# Patient Record
Sex: Female | Born: 1960 | Race: White | Hispanic: No | Smoking: Current every day smoker
Health system: Southern US, Community
[De-identification: ages and names within clinical notes are randomized; demographics above are authoritative.]

## PROBLEM LIST (undated history)

## (undated) DIAGNOSIS — O020 Blighted ovum and nonhydatidiform mole: Secondary | ICD-10-CM

## (undated) DIAGNOSIS — C539 Malignant neoplasm of cervix uteri, unspecified: Secondary | ICD-10-CM

## (undated) DIAGNOSIS — K635 Polyp of colon: Secondary | ICD-10-CM

## (undated) DIAGNOSIS — D249 Benign neoplasm of unspecified breast: Secondary | ICD-10-CM

## (undated) HISTORY — DX: Benign neoplasm of unspecified breast: D24.9

## (undated) HISTORY — DX: Blighted ovum and nonhydatidiform mole: O02.0

## (undated) HISTORY — PX: BREAST BIOPSY: SHX20

## (undated) HISTORY — DX: Malignant neoplasm of cervix uteri, unspecified: C53.9

## (undated) HISTORY — DX: Polyp of colon: K63.5

## (undated) HISTORY — PX: DILATION AND CURETTAGE OF UTERUS: SHX78

## (undated) HISTORY — PX: BREAST SURGERY: SHX581

## (undated) HISTORY — PX: ANTERIOR CRUCIATE LIGAMENT REPAIR: SHX115

---

## 1989-01-13 HISTORY — PX: CERVICAL BIOPSY  W/ LOOP ELECTRODE EXCISION: SUR135

## 2005-09-03 ENCOUNTER — Other Ambulatory Visit: Admission: RE | Admit: 2005-09-03 | Discharge: 2005-09-03 | Payer: Self-pay | Admitting: Gynecology

## 2005-09-17 ENCOUNTER — Encounter: Admission: RE | Admit: 2005-09-17 | Discharge: 2005-09-17 | Payer: Self-pay | Admitting: Gynecology

## 2005-11-07 ENCOUNTER — Ambulatory Visit (HOSPITAL_COMMUNITY): Admission: RE | Admit: 2005-11-07 | Discharge: 2005-11-07 | Payer: Self-pay | Admitting: Gastroenterology

## 2005-11-14 ENCOUNTER — Encounter (INDEPENDENT_AMBULATORY_CARE_PROVIDER_SITE_OTHER): Payer: Self-pay | Admitting: *Deleted

## 2005-11-14 ENCOUNTER — Ambulatory Visit (HOSPITAL_BASED_OUTPATIENT_CLINIC_OR_DEPARTMENT_OTHER): Admission: RE | Admit: 2005-11-14 | Discharge: 2005-11-14 | Payer: Self-pay | Admitting: General Surgery

## 2006-05-14 ENCOUNTER — Other Ambulatory Visit: Admission: RE | Admit: 2006-05-14 | Discharge: 2006-05-14 | Payer: Self-pay | Admitting: Gynecology

## 2008-05-26 ENCOUNTER — Ambulatory Visit: Payer: Self-pay | Admitting: Gynecology

## 2008-05-26 ENCOUNTER — Encounter: Payer: Self-pay | Admitting: Gynecology

## 2008-05-26 ENCOUNTER — Other Ambulatory Visit: Admission: RE | Admit: 2008-05-26 | Discharge: 2008-05-26 | Payer: Self-pay | Admitting: Gynecology

## 2008-06-06 ENCOUNTER — Encounter: Admission: RE | Admit: 2008-06-06 | Discharge: 2008-06-06 | Payer: Self-pay | Admitting: Gynecology

## 2008-06-09 ENCOUNTER — Ambulatory Visit: Payer: Self-pay | Admitting: Gynecology

## 2008-06-09 ENCOUNTER — Encounter: Admission: RE | Admit: 2008-06-09 | Discharge: 2008-06-09 | Payer: Self-pay | Admitting: Gynecology

## 2008-11-28 ENCOUNTER — Other Ambulatory Visit: Admission: RE | Admit: 2008-11-28 | Discharge: 2008-11-28 | Payer: Self-pay | Admitting: Gynecology

## 2008-11-28 ENCOUNTER — Ambulatory Visit: Payer: Self-pay | Admitting: Gynecology

## 2009-05-29 ENCOUNTER — Other Ambulatory Visit: Admission: RE | Admit: 2009-05-29 | Discharge: 2009-05-29 | Payer: Self-pay | Admitting: Gynecology

## 2009-05-29 ENCOUNTER — Ambulatory Visit: Payer: Self-pay | Admitting: Gynecology

## 2010-02-04 ENCOUNTER — Encounter: Payer: Self-pay | Admitting: Gynecology

## 2010-04-04 ENCOUNTER — Other Ambulatory Visit: Payer: Self-pay | Admitting: Orthopedic Surgery

## 2010-04-04 ENCOUNTER — Ambulatory Visit
Admission: RE | Admit: 2010-04-04 | Discharge: 2010-04-04 | Disposition: A | Discharge status: Home / Self Care | Payer: BC Managed Care – PPO | Source: Ambulatory Visit | Attending: Orthopedic Surgery | Admitting: Orthopedic Surgery

## 2010-04-04 DIAGNOSIS — M2390 Unspecified internal derangement of unspecified knee: Secondary | ICD-10-CM

## 2010-05-31 ENCOUNTER — Encounter: Payer: Self-pay | Admitting: Gynecology

## 2010-05-31 NOTE — Op Note (Signed)
NAMESERYNA, MAREK             ACCOUNT NO.:  [id number]   MEDICAL RECORD NO.:  [id number]          PATIENT TYPE:  AMB   LOCATION:  ENDO                         FACILITY:  [facility]   PHYSICIAN:  Anselmo Rod, M.D.  DATE OF BIRTH:  [birth year]   DATE OF PROCEDURE:  11/07/2005  DATE OF DISCHARGE:  11/07/2005                                 OPERATIVE REPORT   PROCEDURE PERFORMED:  Screening colonoscopy.   ENDOSCOPIST:  Anselmo Rod, M.D.   INSTRUMENT USED:  Olympus video colonoscope.   INDICATION FOR PROCEDURE:  A 50 year old white female with a family history  of colon cancer in her mother, maternal great-grandmother undergoing  colonoscopy for worsening constipation.  The patient has a longstanding  history of constipation and personal history of cervical cancer.  Rule out  colonic polyps, masses, etc.   PREPROCEDURE PREPARATION:  Informed consent was procured from the patient.  The patient was fasted for 8 hours prior to the procedure and prepped with  Dulcolax pills and a gallon of TriLyte the night prior to the procedure.  Risks and benefits of the procedure including a 10% miss rate of cancer and  polyp were discussed with the patient as well.   PREPROCEDURE PHYSICAL:  The patient had stable vital signs.  NECK:  Supple.  CHEST:  Clear to auscultation, S1, S2 regular.  ABDOMEN:  Soft with normal bowel sounds.   DESCRIPTION OF THE PROCEDURE:  The patient was placed in the left lateral  decubitus position, sedated with 75 mcg of fentanyl and 7.5 mg of Versed in  slow incremental doses.  Once the patient was adequately sedated and  maintained on low flow oxygen and continuous cardiac monitoring, the Olympus  video colonoscope was advanced from the rectum to the cecum.  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  Small internal  hemorrhoids were seen on retroflexion, no masses or polyps were identified.  There was no evidence of diverticulosis.   The terminal ileum was briefly  visualized and appeared normal.  The patient tolerated the procedure well  without complication.   IMPRESSION:  1. 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  2. Small internal hemorrhoids.  3. No masses, polyps or diverticula seen.  4. Normal terminal ileum.   RECOMMENDATIONS:  1. Continue a high fiber diet and avoid use of Cascara, Senna, etc.  2. Use MiraLax as needed.  3. Repeat colonoscopy in the next 5 years considering the patient's family      history of colon cancer and a personal      history of cervical cancer.  If the patient has any abnormal symptoms      in the interim, she should contact the office immediately.  4. Outpatient follow up as need arises in the future.      Anselmo Rod, M.D.  Electronically Signed     JNM/MEDQ  D:  11/07/2005  T:  11/09/2005  Job:  [id number]   cc:   Gaetano Hawthorne. Lily Peer, M.D.

## 2010-05-31 NOTE — Op Note (Signed)
NAMEMARSHAE, Wanda Swanson             ACCOUNT NO.:  [id number]   MEDICAL RECORD NO.:  [id number]          PATIENT TYPE:  AMB   LOCATION:  ENDO                         FACILITY:  [facility]   PHYSICIAN:  Ollen Gross. Vernell Morgans, M.D. DATE OF BIRTH:  Nov 14, 1960   DATE OF PROCEDURE:  11/14/2005  DATE OF DISCHARGE:  11/07/2005                               OPERATIVE REPORT   PREOPERATIVE DIAGNOSIS:  Right breast mass.   POSTOPERATIVE DIAGNOSIS:  Right breast mass.   PROCEDURE:  Right breast biopsy.   SURGEON:  Ollen Gross. Carolynne Edouard, M.D.   ANESTHESIA:  General.   PROCEDURE:  After informed consent was obtained, the patient was brought  to the operating room and placed in the supine position on the operating  room table.  After adequate induction of general anesthesia, the  patient's right breast was prepped with Betadine, draped in the usual  standard manner.  The palpable mass was easy to feel.  A transverse  incision was made overlying the mass.  This was done with a 15 blade  knife.  This incision was carried down through the skin and subcutaneous  tissues sharply with the electrocautery.  Once the breast tissue was  entered, the mass that was palpable was excised sharply with the  electrocautery.  This dissection was done circumferentially until the  mass was completely removed from the breast.  Once it was removed, it  was oriented with long stitch being lateral and short stitch being  superior and sent to pathology for further evaluation.  Hemostasis was  achieved using the Bovie electrocautery.  The wound was irrigated with  copious amounts of saline. The deep layer of the incision was then  closed with interrupted 3-0 Vicryl stitches and the skin was closed with  a running 4-0 Monocryl subcuticular stitch.  Benzoin and Steri-Strips  and sterile dressings were applied.  The patient tolerated the procedure  well.  At the end of the case all needle, sponge and instrument counts  were correct.  The  patient was then awakened, taken to recovery in  stable condition.      Ollen Gross. Vernell Morgans, M.D.  Electronically Signed     PST/MEDQ  D:  11/19/2005  T:  11/20/2005  Job:  [id number]

## 2010-07-01 ENCOUNTER — Other Ambulatory Visit: Payer: Self-pay | Admitting: Gynecology

## 2010-07-01 ENCOUNTER — Encounter (INDEPENDENT_AMBULATORY_CARE_PROVIDER_SITE_OTHER): Payer: BC Managed Care – PPO | Admitting: Gynecology

## 2010-07-01 ENCOUNTER — Other Ambulatory Visit (HOSPITAL_COMMUNITY)
Admission: RE | Admit: 2010-07-01 | Discharge: 2010-07-01 | Disposition: A | Discharge status: Home / Self Care | Payer: BC Managed Care – PPO | Source: Ambulatory Visit | Attending: Gynecology | Admitting: Gynecology

## 2010-07-01 ENCOUNTER — Ambulatory Visit (HOSPITAL_COMMUNITY)
Admission: RE | Admit: 2010-07-01 | Discharge: 2010-07-01 | Disposition: A | Discharge status: Home / Self Care | Payer: BC Managed Care – PPO | Source: Ambulatory Visit | Attending: Gynecology | Admitting: Gynecology

## 2010-07-01 DIAGNOSIS — Z833 Family history of diabetes mellitus: Secondary | ICD-10-CM

## 2010-07-01 DIAGNOSIS — Z0000 Encounter for general adult medical examination without abnormal findings: Secondary | ICD-10-CM | POA: Insufficient documentation

## 2010-07-01 DIAGNOSIS — Z124 Encounter for screening for malignant neoplasm of cervix: Secondary | ICD-10-CM | POA: Insufficient documentation

## 2010-07-01 DIAGNOSIS — Z87891 Personal history of nicotine dependence: Secondary | ICD-10-CM

## 2010-07-01 DIAGNOSIS — Z1211 Encounter for screening for malignant neoplasm of colon: Secondary | ICD-10-CM

## 2010-07-01 DIAGNOSIS — Z801 Family history of malignant neoplasm of trachea, bronchus and lung: Secondary | ICD-10-CM

## 2010-07-01 DIAGNOSIS — F17200 Nicotine dependence, unspecified, uncomplicated: Secondary | ICD-10-CM | POA: Insufficient documentation

## 2010-07-01 DIAGNOSIS — R635 Abnormal weight gain: Secondary | ICD-10-CM

## 2010-07-01 DIAGNOSIS — Z85118 Personal history of other malignant neoplasm of bronchus and lung: Secondary | ICD-10-CM | POA: Insufficient documentation

## 2010-07-01 DIAGNOSIS — R823 Hemoglobinuria: Secondary | ICD-10-CM

## 2010-07-01 DIAGNOSIS — Z13220 Encounter for screening for lipoid disorders: Secondary | ICD-10-CM

## 2010-07-01 DIAGNOSIS — Z01419 Encounter for gynecological examination (general) (routine) without abnormal findings: Secondary | ICD-10-CM

## 2010-07-03 ENCOUNTER — Other Ambulatory Visit: Payer: Self-pay | Admitting: Gynecology

## 2010-07-03 DIAGNOSIS — Z1231 Encounter for screening mammogram for malignant neoplasm of breast: Secondary | ICD-10-CM

## 2010-07-12 ENCOUNTER — Ambulatory Visit
Admission: RE | Admit: 2010-07-12 | Discharge: 2010-07-12 | Disposition: A | Discharge status: Home / Self Care | Payer: BC Managed Care – PPO | Source: Ambulatory Visit | Attending: Gynecology | Admitting: Gynecology

## 2010-07-12 DIAGNOSIS — Z1231 Encounter for screening mammogram for malignant neoplasm of breast: Secondary | ICD-10-CM

## 2010-08-09 ENCOUNTER — Other Ambulatory Visit: Payer: BC Managed Care – PPO

## 2011-07-11 ENCOUNTER — Other Ambulatory Visit (HOSPITAL_COMMUNITY)
Admission: RE | Admit: 2011-07-11 | Discharge: 2011-07-11 | Disposition: A | Discharge status: Home / Self Care | Payer: BC Managed Care – PPO | Source: Ambulatory Visit | Attending: Gynecology | Admitting: Gynecology

## 2011-07-11 ENCOUNTER — Encounter: Payer: Self-pay | Admitting: Gynecology

## 2011-07-11 ENCOUNTER — Ambulatory Visit (INDEPENDENT_AMBULATORY_CARE_PROVIDER_SITE_OTHER): Payer: BC Managed Care – PPO | Admitting: Gynecology

## 2011-07-11 VITALS — BP 150/86 | Ht 61.25 in | Wt 178.0 lb

## 2011-07-11 DIAGNOSIS — Z01419 Encounter for gynecological examination (general) (routine) without abnormal findings: Secondary | ICD-10-CM | POA: Insufficient documentation

## 2011-07-11 DIAGNOSIS — Z1159 Encounter for screening for other viral diseases: Secondary | ICD-10-CM | POA: Insufficient documentation

## 2011-07-11 DIAGNOSIS — Z800 Family history of malignant neoplasm of digestive organs: Secondary | ICD-10-CM

## 2011-07-11 DIAGNOSIS — N951 Menopausal and female climacteric states: Secondary | ICD-10-CM

## 2011-07-11 DIAGNOSIS — Z833 Family history of diabetes mellitus: Secondary | ICD-10-CM

## 2011-07-11 DIAGNOSIS — R634 Abnormal weight loss: Secondary | ICD-10-CM

## 2011-07-11 DIAGNOSIS — D069 Carcinoma in situ of cervix, unspecified: Secondary | ICD-10-CM | POA: Insufficient documentation

## 2011-07-11 DIAGNOSIS — L680 Hirsutism: Secondary | ICD-10-CM

## 2011-07-11 LAB — CBC WITH DIFFERENTIAL/PLATELET
Eosinophils Absolute: 0.6 10*3/uL (ref 0.0–0.7)
Eosinophils Relative: 5 % (ref 0–5)
Hemoglobin: 14.1 g/dL (ref 12.0–15.0)
Lymphocytes Relative: 28 % (ref 12–46)
Lymphs Abs: 3.3 10*3/uL (ref 0.7–4.0)
MCH: 32 pg (ref 26.0–34.0)
MCV: 93.2 fL (ref 78.0–100.0)
Monocytes Absolute: 0.6 10*3/uL (ref 0.1–1.0)
Monocytes Relative: 5 % (ref 3–12)
Platelets: 370 10*3/uL (ref 150–400)
RBC: 4.4 MIL/uL (ref 3.87–5.11)
WBC: 11.9 10*3/uL — ABNORMAL HIGH (ref 4.0–10.5)

## 2011-07-11 NOTE — Patient Instructions (Addendum)
Perimenopause  Perimenopause is the time when your body begins to move into the menopause (no menstrual period for 12 straight months). It is a natural process. Perimenopause can begin 2 to 8 years before the menopause and usually lasts for one year after the menopause. During this time, your ovaries may or may not produce an egg. The ovaries vary in their production of estrogen and progesterone hormones each month. This can cause irregular menstrual periods, difficulty in getting pregnant, vaginal bleeding between periods and uncomfortable symptoms.  CAUSES   Irregular production of the ovarian hormones, estrogen and progesterone, and not ovulating every month.   Other causes include:   Tumor of the pituitary gland in the brain.   Medical disease that affects the ovaries.   Radiation treatment.   Chemotherapy.   Unknown causes.   Heavy smoking and excessive alcohol intake can bring on perimenopause sooner.  SYMPTOMS     Hot flashes.   Night sweats.   Irregular menstrual periods.   Decrease sex drive.   Vaginal dryness.   Headaches.   Mood swings.   Depression.   Memory problems.   Irritability.   Tiredness.   Weight gain.   Trouble getting pregnant.   The beginning of losing bone cells (osteoporosis).   The beginning of hardening of the arteries (atherosclerosis).  DIAGNOSIS    Your caregiver will make a diagnosis by analyzing your age, menstrual history and your symptoms. They will do a physical exam noting any changes in your body, especially your female organs. Female hormone tests may or may not be helpful depending on the amount and when you produce the female hormones. However, other hormone tests may be helpful (ex. thyroid hormone) to rule out other problems.  TREATMENT    The decision to treat during the perimenopause should be made by you and your caregiver depending on how the symptoms are affecting you and your life style. There are various treatments available such as:    Treating individual symptoms with a specific medication for that symptom (ex. tranquilizer for depression).   Herbal medications that can help specific symptoms.   Counseling.   Group therapy.   No treatment.  HOME CARE INSTRUCTIONS     Before seeing your caregiver, make a list of your menstrual periods (when the occur, how heavy they are, how long between periods and how long they last), your symptoms and when they started.   Take the medication as recommended by your caregiver.   Sleep and rest.   Exercise.   Eat a diet that contains calcium (good for your bones) and soy (acts like estrogen hormone).   Do not smoke.   Avoid alcoholic beverages.   Taking vitamin E may help in certain cases.   Take calcium and vitamin D supplements to help prevent bone loss.   Group therapy is sometimes helpful.   Acupuncture may help in some cases.  SEEK MEDICAL CARE IF:     You have any of the above and want to know if it is perimenopause.   You want advice and treatment for any of your symptoms mentioned above.   You need a referral to a specialist (gynecologist, psychiatrist or psychologist).  SEEK IMMEDIATE MEDICAL CARE IF:     You have vaginal bleeding.   Your period lasts longer than 8 days.   You periods are recurring sooner than 21 days.   You have bleeding after intercourse.   You have severe depression.   You have pain   when you urinate.   You have severe headaches.   You develop vision problems.  Document Released: 02/07/2004 Document Revised: 12/19/2010 Document Reviewed: 10/28/2007  [employer] Patient Information 2012 [employer].

## 2011-07-11 NOTE — Progress Notes (Signed)
Wanda Swanson 1960/06/11 [id number]   History:    51 y.o.  for annual gyn exam with multiple issues to discuss today. Patient was complaining of irregular periods whereby she would go 2 or 3 months without a cycle. She has been complaining of hot flashes irritability and mood swings and insomnia as well as some hirsutism. She has lost 20 pounds since last year. She does have a very strong family history of colon cancer and she has had a colonoscopy 4 years ago and benign polyps had been removed. She also has a very strong family history diabetes as well. She has been a smoker for many years and last year we did a chest x-ray PA and lateral which was normal. She had stopped smoking and then restarted again in May but promises she will discontinue. Her husband has had a vasectomy. 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 Her Pap smear since then have been normal otherwise.  Past medical history,surgical history, family history and social history were all reviewed and documented in the EPIC chart.  Gynecologic History Patient's last menstrual period was 06/28/2011. Contraception: vasectomy Last Pap: 2012. Results were: normal Last mammogram: 2012. Results were: normal  Obstetric History OB History    Grav Para Term Preterm Abortions TAB SAB Ect Mult Living   2 1  1 1  1   1      # Outc Date GA Lbr Len/2nd Wgt Sex Del Anes PTL Lv   1 PRE     F SVD  Yes Yes   2 SAB                ROS: A ROS was performed and pertinent positives and negatives are included in the history.  GENERAL: No fevers or chills. HEENT: No change in vision, no earache, sore throat or sinus congestion. NECK: No pain or stiffness. CARDIOVASCULAR: No chest pain or pressure. No palpitations. PULMONARY: No shortness of breath, cough or wheeze. GASTROINTESTINAL: No abdominal pain, nausea, vomiting or diarrhea, melena or bright red blood per rectum.  GENITOURINARY: No urinary frequency, urgency, hesitancy or dysuria. MUSCULOSKELETAL: No joint or muscle pain, no back pain, no recent trauma. DERMATOLOGIC: No rash, no itching, no lesions. ENDOCRINE: No polyuria, polydipsia, no heat or cold intolerance. No recent change in weight. HEMATOLOGICAL: No anemia or easy bruising or bleeding. NEUROLOGIC: No headache, seizures, numbness, tingling or weakness. PSYCHIATRIC: No depression, no loss of interest in normal activity or change in sleep pattern.     Exam: chaperone present  BP 150/86  Ht 5' 1.25" (1.556 m)  Wt 178 lb (80.74 kg)  BMI 33.36 kg/m2  LMP 06/28/2011  Body mass index is 33.36 kg/(m^2).  General appearance : Well developed well nourished female. No acute distress HEENT: Neck supple, trachea midline, no carotid bruits, no thyroidmegaly Lungs: Clear to auscultation, no rhonchi or wheezes, or rib retractions  Heart: Regular rate and rhythm, no murmurs or gallops Breast:Examined in sitting and supine position were symmetrical in appearance, no palpable masses or tenderness,  no skin retraction, no nipple inversion, no nipple discharge, no skin discoloration, no axillary or supraclavicular lymphadenopathy Abdomen: no palpable masses or tenderness, no rebound or guarding Extremities: no edema or skin discoloration or tenderness  Pelvic:  Bartholin, Urethra, Skene Glands: Within normal limits             Vagina: Menstrual blood present  Cervix: Leukoplakia noted anterior cervical lip  Uterus  anteverted, normal size, shape and consistency, non-tender and mobile  Adnexa  Without masses or tenderness  Anus and perineum  normal   Rectovaginal  normal sphincter tone without palpated masses or tenderness             Hemoccult cards provided for patient to submit to the office for testing     Assessment/Plan:  51 y.o. female for annual exam who incidentally was found to have a leukoplakic area on the anterior cervical lip. Patient with  prior history of CIN-3 in 1991 with followup Pap smears negative. Patient will be asked to return back next week for colposcopic evaluation and biopsy. Because of her menopausal symptoms an  FSH will be drawn today along with the following labs: Hemoglobin A1c, total cholesterol, CBC, TSH, urinalysis, total testosterone, along with Pap smear. Requisition was provided her to schedule her mammogram. She was reminded to followup with her gastroenterologist for her colonoscopy. She will stand to the office the Hemoccult cards at a later date for testing. Patient had complained of some hirsutism and this is the reason a total testosterone was ordered. I have given her name so some internal medicine physicians in the community for her to be established as well.   Ok Edwards MD, 5:01 PM 07/11/2011

## 2011-07-12 LAB — URINALYSIS W MICROSCOPIC + REFLEX CULTURE
Casts: NONE SEEN
Glucose, UA: NEGATIVE mg/dL
Leukocytes, UA: NEGATIVE
pH: 6.5 (ref 5.0–8.0)

## 2011-07-12 LAB — HEMOGLOBIN A1C: Hgb A1c MFr Bld: 5.7 % — ABNORMAL HIGH (ref <5.7)

## 2011-07-14 ENCOUNTER — Other Ambulatory Visit: Payer: Self-pay | Admitting: Gynecology

## 2011-07-14 DIAGNOSIS — D649 Anemia, unspecified: Secondary | ICD-10-CM

## 2011-07-14 DIAGNOSIS — Z1231 Encounter for screening mammogram for malignant neoplasm of breast: Secondary | ICD-10-CM

## 2011-07-14 DIAGNOSIS — R3129 Other microscopic hematuria: Secondary | ICD-10-CM

## 2011-07-14 DIAGNOSIS — R739 Hyperglycemia, unspecified: Secondary | ICD-10-CM

## 2011-07-14 DIAGNOSIS — E7800 Pure hypercholesterolemia, unspecified: Secondary | ICD-10-CM

## 2011-07-18 ENCOUNTER — Encounter: Payer: Self-pay | Admitting: Gynecology

## 2011-07-18 ENCOUNTER — Other Ambulatory Visit: Payer: BC Managed Care – PPO

## 2011-07-18 ENCOUNTER — Ambulatory Visit (INDEPENDENT_AMBULATORY_CARE_PROVIDER_SITE_OTHER): Payer: BC Managed Care – PPO | Admitting: Gynecology

## 2011-07-18 ENCOUNTER — Ambulatory Visit
Admission: RE | Admit: 2011-07-18 | Discharge: 2011-07-18 | Disposition: A | Discharge status: Home / Self Care | Payer: BC Managed Care – PPO | Source: Ambulatory Visit | Attending: Gynecology | Admitting: Gynecology

## 2011-07-18 VITALS — BP 120/84

## 2011-07-18 DIAGNOSIS — R3129 Other microscopic hematuria: Secondary | ICD-10-CM

## 2011-07-18 DIAGNOSIS — N889 Noninflammatory disorder of cervix uteri, unspecified: Secondary | ICD-10-CM

## 2011-07-18 DIAGNOSIS — Z1231 Encounter for screening mammogram for malignant neoplasm of breast: Secondary | ICD-10-CM

## 2011-07-18 DIAGNOSIS — D649 Anemia, unspecified: Secondary | ICD-10-CM

## 2011-07-18 DIAGNOSIS — E7800 Pure hypercholesterolemia, unspecified: Secondary | ICD-10-CM

## 2011-07-18 DIAGNOSIS — R739 Hyperglycemia, unspecified: Secondary | ICD-10-CM

## 2011-07-18 LAB — CBC WITH DIFFERENTIAL/PLATELET
Eosinophils Absolute: 0.4 10*3/uL (ref 0.0–0.7)
Hemoglobin: 14.1 g/dL (ref 12.0–15.0)
Lymphocytes Relative: 30 % (ref 12–46)
Lymphs Abs: 2.9 10*3/uL (ref 0.7–4.0)
MCH: 32 pg (ref 26.0–34.0)
Monocytes Relative: 4 % (ref 3–12)
Neutro Abs: 6 10*3/uL (ref 1.7–7.7)
Neutrophils Relative %: 61 % (ref 43–77)
RBC: 4.41 MIL/uL (ref 3.87–5.11)

## 2011-07-18 LAB — LIPID PANEL
LDL Cholesterol: 150 mg/dL — ABNORMAL HIGH (ref 0–99)
Triglycerides: 132 mg/dL (ref <150)
VLDL: 26 mg/dL (ref 0–40)

## 2011-07-18 MED ORDER — CLINDAMYCIN PHOSPHATE 2 % VA CREA: 1.0000 | TOPICAL_CREAM | Freq: Every day | VAGINAL | Status: AC

## 2011-07-18 NOTE — Progress Notes (Signed)
Patient presented to the office today for colposcopic evaluation as a result of her recent office visit at time of her Pap smear she was noted to have a leukoplakic area on the anterior cervical lip.Wanda Swanson Her recent Pap smear was otherwise normal with the exception of hyperkeratosis. 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 Patient's recent blood sugar was 117 (hemoglobin A1c 5.7) and her total cholesterol was 117 and her CBC had demonstrated she had a wet blood count of 11.7 and the rest of her labs were otherwise normal.  Colposcopic evaluation:  Physical Exam  Genitourinary:      Patient underwent a detail colposcopic evaluation. Although she had a normal Pap smear but due to the fact that incidentally she was noted to have a leukoplakic area on the anterior cervical lip and with her past history of CIN-3 it was recommended she undergo this evaluation today. The external genitalia and vagina and fornices were normal. Acetic acid was applied and leukoplakic area was noted at the 12:00 position which was biopsied which was submitted for histological evaluation along with an ECC. Monsel solution was used for hemostasis. The following labs were repeated today: Fasting blood sugar, fasting lipid profile, along with CBC and urinalysis. Will notify patient when the results become available and manage accordingly.

## 2011-07-18 NOTE — Patient Instructions (Addendum)
Cholesterol Control Diet Cholesterol levels in your body are determined significantly by your diet. Cholesterol levels may also be related to heart disease. The following material helps to explain this relationship and discusses what you can do to help keep your heart healthy. Not all cholesterol is bad. Low-density lipoprotein (LDL) cholesterol is the "bad" cholesterol. It may cause fatty deposits to build up inside your arteries. High-density lipoprotein (HDL) cholesterol is "good." It helps to remove the "bad" LDL cholesterol from your blood. Cholesterol is a very important risk factor for heart disease. Other risk factors are high blood pressure, smoking, stress, heredity, and weight. The heart muscle gets its supply of blood through the coronary arteries. If your LDL cholesterol is high and your HDL cholesterol is low, you are at risk for having fatty deposits build up in your coronary arteries. This leaves less room through which blood can flow. Without sufficient blood and oxygen, the heart muscle cannot function properly and you may feel chest pains (angina pectoris). When a coronary artery closes up entirely, a part of the heart muscle may die, causing a heart attack (myocardial infarction). CHECKING CHOLESTEROL When your caregiver sends your blood to a lab to be analyzed for cholesterol, a complete lipid (fat) profile may be done. With this test, the total amount of cholesterol and levels of LDL and HDL are determined. Triglycerides are a type of fat that circulates in the blood and can also be used to determine heart disease risk. The list below describes what the numbers should be: Test: Total Cholesterol.  Less than 200 mg/dl.  Test: LDL "bad cholesterol."  Less than 100 mg/dl.   Less than 70 mg/dl if you are at very high risk of a heart attack or sudden cardiac death.  Test: HDL "good cholesterol."  Greater than 50 mg/dl for women.   Greater than 40 mg/dl for men.  Test:  Triglycerides.  Less than 150 mg/dl.  CONTROLLING CHOLESTEROL WITH DIET Although exercise and lifestyle factors are important, your diet is key. That is because certain foods are known to raise cholesterol and others to lower it. The goal is to balance foods for their effect on cholesterol and more importantly, to replace saturated and trans fat with other types of fat, such as monounsaturated fat, polyunsaturated fat, and omega-3 fatty acids. On average, a person should consume no more than 15 to 17 g of saturated fat daily. Saturated and trans fats are considered "bad" fats, and they will raise LDL cholesterol. Saturated fats are primarily found in animal products such as meats, butter, and cream. However, that does not mean you need to sacrifice all your favorite foods. Today, there are good tasting, low-fat, low-cholesterol substitutes for most of the things you like to eat. Choose low-fat or nonfat alternatives. Choose round or loin cuts of red meat, since these types of cuts are lowest in fat and cholesterol. Chicken (without the skin), fish, veal, and ground turkey breast are excellent choices. Eliminate fatty meats, such as hot dogs and salami. Even shellfish have little or no saturated fat. Have a 3 oz (85 g) portion when you eat lean meat, poultry, or fish. Trans fats are also called "partially hydrogenated oils." They are oils that have been scientifically manipulated so that they are solid at room temperature resulting in a longer shelf life and improved taste and texture of foods in which they are added. Trans fats are found in stick margarine, some tub margarines, cookies, crackers, and baked goods.  When   baking and cooking, oils are an excellent substitute for butter. The monounsaturated oils are especially beneficial since it is believed they lower LDL and raise HDL. The oils you should avoid entirely are saturated tropical oils, such as coconut and palm.  Remember to eat liberally from food  groups that are naturally free of saturated and trans fat, including fish, fruit, vegetables, beans, grains (barley, rice, couscous, bulgur wheat), and pasta (without cream sauces).  IDENTIFYING FOODS THAT LOWER CHOLESTEROL  Soluble fiber may lower your cholesterol. This type of fiber is found in fruits such as apples, vegetables such as broccoli, potatoes, and carrots, legumes such as beans, peas, and lentils, and grains such as barley. Foods fortified with plant sterols (phytosterol) may also lower cholesterol. You should eat at least 2 g per day of these foods for a cholesterol lowering effect.  Read package labels to identify low-saturated fats, trans fats free, and low-fat foods at the supermarket. Select cheeses that have only 2 to 3 g saturated fat per ounce. Use a heart-healthy tub margarine that is free of trans fats or partially hydrogenated oil. When buying baked goods (cookies, crackers), avoid partially hydrogenated oils. Breads and muffins should be made from whole grains (whole-wheat or whole oat flour, instead of "flour" or "enriched flour"). Buy non-creamy canned soups with reduced salt and no added fats.  FOOD PREPARATION TECHNIQUES  Never deep-fry. If you must fry, either stir-fry, which uses very little fat, or use non-stick cooking sprays. When possible, broil, bake, or roast meats, and steam vegetables. Instead of dressing vegetables with butter or margarine, use lemon and herbs, applesauce and cinnamon (for squash and sweet potatoes), nonfat yogurt, salsa, and low-fat dressings for salads.  LOW-SATURATED FAT / LOW-FAT FOOD SUBSTITUTES Meats / Saturated Fat (g)  Avoid: Steak, marbled (3 oz/85 g) / 11 g   Choose: Steak, lean (3 oz/85 g) / 4 g   Avoid: Hamburger (3 oz/85 g) / 7 g   Choose: Hamburger, lean (3 oz/85 g) / 5 g   Avoid: Ham (3 oz/85 g) / 6 g   Choose: Ham, lean cut (3 oz/85 g) / 2.4 g   Avoid: Chicken, with skin, dark meat (3 oz/85 g) / 4 g   Choose: Chicken,  skin removed, dark meat (3 oz/85 g) / 2 g   Avoid: Chicken, with skin, light meat (3 oz/85 g) / 2.5 g   Choose: Chicken, skin removed, light meat (3 oz/85 g) / 1 g  Dairy / Saturated Fat (g)  Avoid: Whole milk (1 cup) / 5 g   Choose: Low-fat milk, 2% (1 cup) / 3 g   Choose: Low-fat milk, 1% (1 cup) / 1.5 g   Choose: Skim milk (1 cup) / 0.3 g   Avoid: Hard cheese (1 oz/28 g) / 6 g   Choose: Skim milk cheese (1 oz/28 g) / 2 to 3 g   Avoid: Cottage cheese, 4% fat (1 cup) / 6.5 g   Choose: Low-fat cottage cheese, 1% fat (1 cup) / 1.5 g   Avoid: Ice cream (1 cup) / 9 g   Choose: Sherbet (1 cup) / 2.5 g   Choose: Nonfat frozen yogurt (1 cup) / 0.3 g   Choose: Frozen fruit bar / trace   Avoid: Whipped cream (1 tbs) / 3.5 g   Choose: Nondairy whipped topping (1 tbs) / 1 g  Condiments / Saturated Fat (g)  Avoid: Mayonnaise (1 tbs) / 2 g   Choose: Low-fat mayonnaise (  1 tbs) / 1 g   Avoid: Butter (1 tbs) / 7 g   Choose: Extra light margarine (1 tbs) / 1 g   Avoid: Coconut oil (1 tbs) / 11.8 g   Choose: Olive oil (1 tbs) / 1.8 g   Choose: Corn oil (1 tbs) / 1.7 g   Choose: Safflower oil (1 tbs) / 1.2 g   Choose: Sunflower oil (1 tbs) / 1.4 g   Choose: Soybean oil (1 tbs) / 2.4 g   Choose: Canola oil (1 tbs) / 1 g  Document Released: 12/30/2004 Document Revised: 09/11/2010 Document Reviewed: 06/20/2010 [employer] Patient Information 2012 [employer]. 

## 2011-07-18 NOTE — Addendum Note (Signed)
Addended by: Ok Edwards on: 07/18/2011 03:53 PM   Modules accepted: Orders

## 2011-07-19 ENCOUNTER — Other Ambulatory Visit: Payer: Self-pay | Admitting: Gynecology

## 2011-07-19 LAB — URINALYSIS W MICROSCOPIC + REFLEX CULTURE
Bilirubin Urine: NEGATIVE
Protein, ur: NEGATIVE mg/dL
Squamous Epithelial / LPF: NONE SEEN
Urobilinogen, UA: 0.2 mg/dL (ref 0.0–1.0)

## 2011-07-21 ENCOUNTER — Other Ambulatory Visit: Payer: Self-pay | Admitting: *Deleted

## 2011-07-21 DIAGNOSIS — E7800 Pure hypercholesterolemia, unspecified: Secondary | ICD-10-CM

## 2011-07-28 ENCOUNTER — Other Ambulatory Visit: Payer: Self-pay | Admitting: *Deleted

## 2011-07-28 DIAGNOSIS — Z1211 Encounter for screening for malignant neoplasm of colon: Secondary | ICD-10-CM

## 2011-07-29 ENCOUNTER — Other Ambulatory Visit: Payer: Self-pay | Admitting: Gynecology

## 2011-07-29 DIAGNOSIS — Z1211 Encounter for screening for malignant neoplasm of colon: Secondary | ICD-10-CM

## 2011-11-19 ENCOUNTER — Encounter: Payer: Self-pay | Admitting: Family Medicine

## 2011-11-19 ENCOUNTER — Ambulatory Visit (INDEPENDENT_AMBULATORY_CARE_PROVIDER_SITE_OTHER): Payer: BC Managed Care – PPO | Admitting: Family Medicine

## 2011-11-19 VITALS — BP 128/80 | HR 95 | Temp 98.0°F | Resp 16 | Ht 63.0 in | Wt 173.0 lb

## 2011-11-19 DIAGNOSIS — Z800 Family history of malignant neoplasm of digestive organs: Secondary | ICD-10-CM

## 2011-11-19 DIAGNOSIS — D239 Other benign neoplasm of skin, unspecified: Secondary | ICD-10-CM

## 2011-11-19 DIAGNOSIS — Z23 Encounter for immunization: Secondary | ICD-10-CM

## 2011-11-19 DIAGNOSIS — D229 Melanocytic nevi, unspecified: Secondary | ICD-10-CM

## 2011-11-19 DIAGNOSIS — F4323 Adjustment disorder with mixed anxiety and depressed mood: Secondary | ICD-10-CM

## 2011-11-19 DIAGNOSIS — L723 Sebaceous cyst: Secondary | ICD-10-CM

## 2011-11-19 DIAGNOSIS — E785 Hyperlipidemia, unspecified: Secondary | ICD-10-CM

## 2011-11-19 LAB — LIPID PANEL
Cholesterol: 218 mg/dL — ABNORMAL HIGH (ref 0–200)
HDL: 38.6 mg/dL — ABNORMAL LOW (ref >39.00)
Triglycerides: 258 mg/dL — ABNORMAL HIGH (ref 0.0–149.0)
VLDL: 51.6 mg/dL — ABNORMAL HIGH (ref 0.0–40.0)

## 2011-11-19 MED ORDER — TETANUS-DIPHTH-ACELL PERTUSSIS 5-2.5-18.5 LF-MCG/0.5 IM SUSP: 0.5000 mL | Freq: Once | INTRAMUSCULAR | Status: DC

## 2011-11-19 NOTE — Patient Instructions (Addendum)
Follow up in 6 months to recheck cholesterol We'll call you with your genetic and derm referrals Consider calling Hospice at [phone] for grief counseling Keep up the good work on diet and exercise- you look great! Call with any questions or concerns Welcome!  We're glad to have you! Happy [birth year] Birthday!

## 2011-11-19 NOTE — Progress Notes (Signed)
  Subjective:    Patient ID: Wanda Swanson, female    DOB: 07/07/1960, 51 y.o.   MRN: [id number]  HPI New to establish.  [name].  No previous PCP.  Hyperlipidemia- dx'd on labs this summer.  Not currently on meds.  Attempting to follow heart healthy diet and regular exercise.  Pt had labs done w/ Dr Loreta Ave recently but not lipids.  Grief- mother passed away 5/10.  Mom had colon, ovarian, and kidney cancer previously.  12/12 was dx'd w/ pancreatic cancer and given 6 months.  Is not currently in counseling.  Has colonoscopy scheduled for 11/22 w/ Dr Loreta Ave.  Hx of cervical cancer, molar pregnancy- following closely w/ GYN.  Has not had hysterectomy.  UTD on mammo.  L submandibular sebaceous cyst- will intermittently flare, 'the drainage is horrible- it smells so bad'.  Wants this in her record so she can call for abx when it flares  Sunspots/multiple moles- would like Derm referral for complete evaluation.   Review of Systems For ROS see HPI     Objective:   Physical Exam  Constitutional: She is oriented to person, place, and time. She appears well-developed and well-nourished. No distress.  HENT:  Head: Normocephalic and atraumatic.  Eyes: Conjunctivae normal and EOM are normal. Pupils are equal, round, and reactive to light.  Neck: Normal range of motion. Neck supple. No thyromegaly present.  Cardiovascular: Normal rate, regular rhythm, normal heart sounds and intact distal pulses.   No murmur heard. Pulmonary/Chest: Effort normal and breath sounds normal. No respiratory distress.  Abdominal: Soft. She exhibits no distension. There is no tenderness.  Musculoskeletal: She exhibits no edema.  Lymphadenopathy:    She has no cervical adenopathy.  Neurological: She is alert and oriented to person, place, and time.  Skin: Skin is warm and dry.       Multiple moles and keratoses  Psychiatric: She has a normal mood and affect. Her behavior is normal.          Assessment &  Plan:

## 2011-11-30 NOTE — Assessment & Plan Note (Signed)
New to provider.  Not currently infected or active but pt reports this flare intermittently and would like to be able to call in for abx.  Notation made in chart

## 2011-11-30 NOTE — Assessment & Plan Note (Signed)
New to provider.  Pt attempting to control w/ healthy diet and regular exercise.  Check labs.  Start meds prn.

## 2011-11-30 NOTE — Assessment & Plan Note (Signed)
New to provider.  Pt desires referral to derm for complete evaluation- referral done.

## 2011-11-30 NOTE — Assessment & Plan Note (Signed)
New.  Due to mother's recent death.  Has not been in counseling for this.  Still angry.  Discussed Hospice for grief counseling- # provided.  Will follow.

## 2011-11-30 NOTE — Assessment & Plan Note (Signed)
New to provider.  Refer to genetic counseling for possible genetic testing.

## 2011-12-18 MED ORDER — ATORVASTATIN CALCIUM 20 MG PO TABS: 20.0000 mg | ORAL_TABLET | Freq: Every day | ORAL | Status: DC

## 2011-12-29 ENCOUNTER — Ambulatory Visit (HOSPITAL_BASED_OUTPATIENT_CLINIC_OR_DEPARTMENT_OTHER): Payer: BC Managed Care – PPO | Admitting: Hematology & Oncology

## 2011-12-29 ENCOUNTER — Ambulatory Visit: Payer: BC Managed Care – PPO

## 2011-12-29 ENCOUNTER — Other Ambulatory Visit (HOSPITAL_BASED_OUTPATIENT_CLINIC_OR_DEPARTMENT_OTHER): Payer: BC Managed Care – PPO | Admitting: Lab

## 2011-12-29 VITALS — BP 138/73 | HR 95 | Temp 98.8°F | Resp 18 | Ht 62.0 in | Wt 174.0 lb

## 2011-12-29 DIAGNOSIS — Z800 Family history of malignant neoplasm of digestive organs: Secondary | ICD-10-CM

## 2011-12-29 DIAGNOSIS — D069 Carcinoma in situ of cervix, unspecified: Secondary | ICD-10-CM

## 2011-12-29 DIAGNOSIS — K8689 Other specified diseases of pancreas: Secondary | ICD-10-CM

## 2011-12-29 DIAGNOSIS — Z8049 Family history of malignant neoplasm of other genital organs: Secondary | ICD-10-CM

## 2011-12-29 DIAGNOSIS — Z808 Family history of malignant neoplasm of other organs or systems: Secondary | ICD-10-CM

## 2011-12-29 LAB — CBC WITH DIFFERENTIAL (CANCER CENTER ONLY)
BASO%: 1.2 % (ref 0.0–2.0)
Eosinophils Absolute: 0.1 10*3/uL (ref 0.0–0.5)
LYMPH#: 0.9 10*3/uL (ref 0.9–3.3)
MCV: 95 fL (ref 81–101)
MONO#: 0.4 10*3/uL (ref 0.1–0.9)
NEUT#: 3.7 10*3/uL (ref 1.5–6.5)
Platelets: 265 10*3/uL (ref 145–400)
RBC: 4.22 10*6/uL (ref 3.70–5.32)
RDW: 13 % (ref 11.1–15.7)
WBC: 5.2 10*3/uL (ref 3.9–10.0)

## 2011-12-29 NOTE — Progress Notes (Signed)
This office note has been dictated.

## 2011-12-29 NOTE — Progress Notes (Signed)
CC:   Neena Rhymes, M.D. Juan H. Lily Peer, M.D. Anselmo Rod, MD, Clementeen Graham  DIAGNOSES: 1. Family history of multiple malignancies. 2. History of hydatidiform mole. 3. History of cervical cancer.  HISTORY OF PRESENT ILLNESS:  Wanda Swanson is a really nice 51 year old white female.  She is originally from [place].  She had her first and only child at age 53.  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  This was removed via D and C.  At age 51 she was found have cervical cancer.  This must have been very early, or possibly in situ disease, as she only had a LEEP procedure for this.  She sees Dr. Reynaldo Minium for her GYN needs.  She sees Dr. Beverely Low for her general medical needs.  She did have a colonoscopy recently by Dr. Loreta Ave.  She has had colonoscopies in the past.  What is incredibly interesting with Wanda Swanson is her family history. She says her mother died of pancreatic cancer, I think this past year. She lives in [place].  Her mother, however, had a history of uterine, colon, and kidney cancer all at the same time, according to Ms. Waltrip. She was treated at the [facility] center in [place].  She had left kidney cancer.  She had uterine cancer.  She had colon cancer.  She never took any chemotherapy for any of these malignancies.  She has a strong family history of cancer on her mother's side of the family.  Her mother's sister had breast cancer.  Two, I think, cousins had breast cancer.  [name] wanted to make sure that she was checked for cancer.  As such, she was referred out to the [facility] for an evaluation.  She feels okay.  She has been seeing quite a few doctors.  She has a little bit of a col she says.  She does smoke, but is cutting back quite a bit.  She really never has smoked much.  She has had no problems with cough or shortness breath.  She has not had any abdominal pain.  She has had  no headache.  She has had no rashes.  The patient did have a mole removed under the right breast recently.  This was not malignant.  She has never had any type of genetic testing as far she knows.  She had a mammogram earlier this year.  Again, she is kindly referred to the [facility] for an evaluation.  PAST MEDICAL HISTORY:  Remarkable for:  1. The hydatidiform mole. 2. Cervical cancer. 3. Hyperlipidemia.  ALLERGIES: 1. Codeine. 2. Propoxyphene. 3. Naprosyn. 4. OxyContin. 5. Floxin. 6. Hydrocodone.  MEDICATIONS: 1. Calcium with vitamin D1 p.o. daily. 2. Claritin 10 mg p.o. daily. 3. Multivitamins and supplements.  SOCIAL HISTORY:  Remarkable for tobacco use.  Again, she never smoked more than, I think,  a half pack per day.  She has no significant alcohol use.  There are no occupational exposures.  FAMILY HISTORY:  Documented previously.  There is a strong family history of malignancy on her mom's side of the family.  REVIEW OF SYSTEMS:  As stated in the history of present illness.  PHYSICAL EXAMINATION:  General:  This is a well-developed, well- nourished white female in no obvious distress.  Vital signs: Temperature of 96.8, pulse 95, respiratory rate 18, blood pressure 138/73.  Weight is 174.  Head and neck:  Normocephalic, atraumatic skull.  There are no ocular or oral lesions.  There are no palpable cervical or supraclavicular lymph nodes.  Lungs:  Clear bilaterally. Cardiac:  Regular rate and rhythm with a normal S1 and S2.  There are no murmurs, rubs, or bruits.  Abdomen:  Soft with good bowel sounds.  There is some slight fullness in the abdomen.  There is some slight epigastric tenderness to palpation.  No obvious fluid wave is noted.  There is no palpable hepatosplenomegaly.  Back:  No tenderness over the spine, ribs, or hips.  Extremities:  No clubbing, cyanosis, or edema.  She has good range motion of her joints.  Skin:  No rashes.   She has numerous hyperpigmented lesions on the skin.  I do not see anything that looks suspicious.  Neurological:  No focal neurological deficits.  LABORATORY STUDIES:  White cell count 5.2, hemoglobin 13.5, hematocrit 40.2, platelet count 265.  IMPRESSION:  Wanda Swanson is a very charming 51 year old white female with a very strong family history of malignancy.  Again, this is on her mother's side of the family.  I really believe that Ms. Kilburg is going to need genetic testing.  She may need to be tested for the Lynch syndrome.  I suppose that she could be tested for BRCA1 and BRCA2.  I will defer to the [facility] and see if they cannot help [place] out.  I also want to get an ultrasound of her abdomen.  I want to make sure that nothing is going on with her pancreas.  There is a little bit of fullness in this area.  I know she has not lost any weight.  However, given her family history, I really believe that an ultrasound is warranted.  Hopefully, we will not need to see Ms. Peet back.  I think we would only have to see her back if she is diagnosed with another cancer.  I spent a good hour or more with Ms. Bond.  She is very nice to talk to.  She has lived in different parts of the country which was fun to discuss with her.  Again, we will see about getting the ultrasound set up for tomorrow or Wednesday.  We will see about the [facility] being set up.    ______________________________ Josph Macho, M.D. PRE/MEDQ  D:  12/29/2011  T:  12/29/2011  Job:  [id number]

## 2011-12-30 ENCOUNTER — Telehealth: Payer: Self-pay | Admitting: Hematology & Oncology

## 2011-12-30 LAB — COMPREHENSIVE METABOLIC PANEL
ALT: 35 U/L (ref 0–35)
Albumin: 4.6 g/dL (ref 3.5–5.2)
CO2: 25 mEq/L (ref 19–32)
Chloride: 103 mEq/L (ref 96–112)
Glucose, Bld: 104 mg/dL — ABNORMAL HIGH (ref 70–99)
Potassium: 3.8 mEq/L (ref 3.5–5.3)
Sodium: 138 mEq/L (ref 135–145)
Total Bilirubin: 0.3 mg/dL (ref 0.3–1.2)
Total Protein: 7 g/dL (ref 6.0–8.3)

## 2011-12-30 LAB — LACTATE DEHYDROGENASE: LDH: 161 U/L (ref 94–250)

## 2011-12-30 LAB — CANCER ANTIGEN 19-9: CA 19-9: 7.7 U/mL (ref <35.0)

## 2011-12-30 NOTE — Telephone Encounter (Signed)
Left pt message with 12-19 appointment time location and to be NPO 6 hrs. I asked pt to call for the rest of the details.

## 2011-12-30 NOTE — Telephone Encounter (Signed)
Left message with Clydie Braun in [place] to make sure she got referral on patient.

## 2012-01-01 ENCOUNTER — Ambulatory Visit (HOSPITAL_BASED_OUTPATIENT_CLINIC_OR_DEPARTMENT_OTHER)
Admission: RE | Admit: 2012-01-01 | Discharge: 2012-01-01 | Disposition: A | Discharge status: Home / Self Care | Payer: BC Managed Care – PPO | Source: Ambulatory Visit | Attending: Hematology & Oncology | Admitting: Hematology & Oncology

## 2012-01-01 DIAGNOSIS — Z800 Family history of malignant neoplasm of digestive organs: Secondary | ICD-10-CM | POA: Insufficient documentation

## 2012-01-01 DIAGNOSIS — R1013 Epigastric pain: Secondary | ICD-10-CM | POA: Insufficient documentation

## 2012-01-01 DIAGNOSIS — C539 Malignant neoplasm of cervix uteri, unspecified: Secondary | ICD-10-CM | POA: Insufficient documentation

## 2012-01-02 ENCOUNTER — Telehealth: Payer: Self-pay | Admitting: Oncology

## 2012-01-02 NOTE — Telephone Encounter (Addendum)
Message copied by Lacie Draft on Fri Jan 02, 2012  4:27 PM ------      Message from: Josph Macho      Created: Tue Dec 30, 2011  9:15 PM       Call - labs are ok!!  Still awaiting U/S.  We are working on genetics clinic appt.!!  Loyal Jacobson with patient regarding labs and negative [place]. Teola Bradley, Eswin Worrell [employer]

## 2012-01-12 ENCOUNTER — Ambulatory Visit: Payer: BC Managed Care – PPO | Admitting: Lab

## 2012-01-12 ENCOUNTER — Ambulatory Visit (HOSPITAL_BASED_OUTPATIENT_CLINIC_OR_DEPARTMENT_OTHER): Payer: BC Managed Care – PPO | Admitting: Genetic Counselor

## 2012-01-12 ENCOUNTER — Encounter: Payer: Self-pay | Admitting: Genetic Counselor

## 2012-01-12 DIAGNOSIS — Z808 Family history of malignant neoplasm of other organs or systems: Secondary | ICD-10-CM

## 2012-01-12 DIAGNOSIS — Z800 Family history of malignant neoplasm of digestive organs: Secondary | ICD-10-CM

## 2012-01-12 NOTE — Progress Notes (Signed)
Dr. Arlan Swanson requested a consultation for genetic counseling and risk assessment for Wanda Swanson, a 51 y.o. female, for discussion of her family history of colon, lung, kidney, breast and pancreatic cancer. She presents to clinic today to discuss the possibility of a genetic predisposition to cancer, and to further clarify her risks, as well as her family members' risks for cancer.   HISTORY OF PRESENT ILLNESS: Wanda Swanson is a 51 y.o. female with no personal history of cancer.    Past Medical History  Diagnosis Date  . 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 Molar pregnancy   . Polyp of colon     BENIGN  . Fibroadenoma of breast     RIGHT  . H/O: vasectomy     PT'S PARTNER WITH VASECTOMY  . Cervical cancer 35    Past Surgical History  Procedure Date  . Cervical biopsy  w/ loop electrode excision 1991  . Breast surgery   . Breast biopsy     2007 (RIGHT)  . Dilation and curettage of uterus   . Anterior cruciate ligament repair     LEFT KNEE    History  Substance Use Topics  . Smoking status: Current Every Day Smoker -- 36 years  . Smokeless tobacco: Never Used  . Alcohol Use: Yes    REPRODUCTIVE HISTORY AND PERSONAL RISK ASSESSMENT FACTORS: Menarche was at age 41.   Perimenopausal Uterus Intact: Yes Ovaries Intact: Yes G2P1A1 , first live birth at age 55  She has not previously undergone treatment for infertility.   OCP use for 18 years   She has not used HRT in the past.    FAMILY HISTORY:  We obtained a detailed, 4-generation family history.  Significant diagnoses are listed below: Family History  Problem Relation Age of Onset  . Hypertension Mother   . Cancer Mother 22    COLON, UTERINE, LUNG CARCINOMA, KIDNEY, PANCREATIC  . Hypertension Sister   . Breast cancer Maternal Aunt 63  . Diabetes Maternal Grandmother   . Hypertension Maternal Grandmother   The patient was diagnosed with a molar pregnancy at age 70, and developed cervical cancer at age  65.  She has four maternal half sisters, one who also has had cervical cancer.  The patient's mother was diagnosed in her early 19s with a lung carcinoma.  She went on to develop colon, kidney and uterine cancer at age 71 and pancreatic cancer at age 1.  Her mother had a full sister who had breast cancer at age 52 and died at 52, and a maternal half sister who is cancer free.  The patient's maternal grandfather had a nephew with hodkins disease, a niece with leukemia at 44 and another niece with breast cancer.  There is no information about the patient's fathers side of the family.  Patient's maternal ancestors are of [place] and [place] descent, and paternal ancestors are of unknown descent. There is no reported Ashkenazi Jewish ancestry. There is no  known consanguinity.  GENETIC COUNSELING RISK ASSESSMENT, DISCUSSION, AND SUGGESTED FOLLOW UP: We reviewed the natural history and genetic etiology of sporadic, familial and hereditary cancer syndromes. About 10% of colon cancer is hereditary.  Approximately 3% are the result of Lynch syndrome.  We reviewed Lynch syndrome cancers, including colon, endometrial, ovarian, gastric, brain and pancreatic cancer. Some families also have breast cancer associated with Lynch syndrome. We also reviewed other cancer syndromes as several of the cancers in her family  can be associated with multiple different syndromes. We reviewed the red flags of hereditary cancer syndromes and the dominant inheritance patterns. We discussed gene panels, and that several cancer genes that are associated with different cancers can be tested at the same time.  Because of the different types of cancer that are in the patient's family, we will consider a panel tests.   The patient's family history of colon, lung, kidney, uterine, pancreatic and breast cancer is suggestive of the following possible diagnosis: hereditary cancer syndrome  We discussed that identification of a hereditary  cancer syndrome may help her care providers tailor the patients medical management. If a mutation indicating a hereditary cancer syndrome is detected in this case, the [employer] recommendations would include increased cancer surveillance and possible prophylactic surgery. If a mutation is detected, the patient will be referred back to the referring provider and to any additional appropriate care providers to discuss the relevant options.   If a mutation is not found in the patient, cancer surveillance options would be discussed for the patient according to the appropriate standard National Comprehensive Cancer Network and American Cancer Society guidelines, with consideration of their personal and family history risk factors. In this case, the patient will be referred back to their care providers for discussions of management.   After considering the risks, benefits, and limitations, the patient provided informed consent for  the following  testing: [facility] MyRisk through [employer].   Per the patient's request, we will contact her by telephone to discuss these results. A follow up genetic counseling visit will be scheduled if indicated.  The patient was seen for a total of 60 minutes, greater than 50% of which was spent face-to-face counseling.  This plan is being carried out per Dr. Otilio Connors recommendations.  This note will also be sent to the referring provider via the electronic medical record. The patient will be supplied with a summary of this genetic counseling discussion as well as educational information on the discussed hereditary cancer syndromes following the conclusion of their visit.   Patient was discussed with Dr. Drue Second. _______________________________________________________________________ For Office Staff:  Number of people involved in session: 2 Was an Intern/ student involved with case: no

## 2012-02-11 ENCOUNTER — Telehealth: Payer: Self-pay | Admitting: Genetic Counselor

## 2012-02-11 NOTE — Telephone Encounter (Signed)
Revealed negative genetic test results 

## 2012-02-20 ENCOUNTER — Encounter: Payer: Self-pay | Admitting: Genetic Counselor

## 2012-04-22 ENCOUNTER — Encounter: Payer: Self-pay | Admitting: Hematology & Oncology

## 2013-11-14 ENCOUNTER — Encounter: Payer: Self-pay | Admitting: Genetic Counselor

## 2014-09-07 IMAGING — US US ABDOMEN COMPLETE
1 series · 14 of 25 positions shown · non-contrast
Comparison: None.

CLINICAL DATA: Epigastric pain.  Cervical cancer

COMPLETE ABDOMINAL ULTRASOUND

[Series 1: us abdomen complete · 0.32mm/px · 14 of 63 slices shown]
[im 1/63]
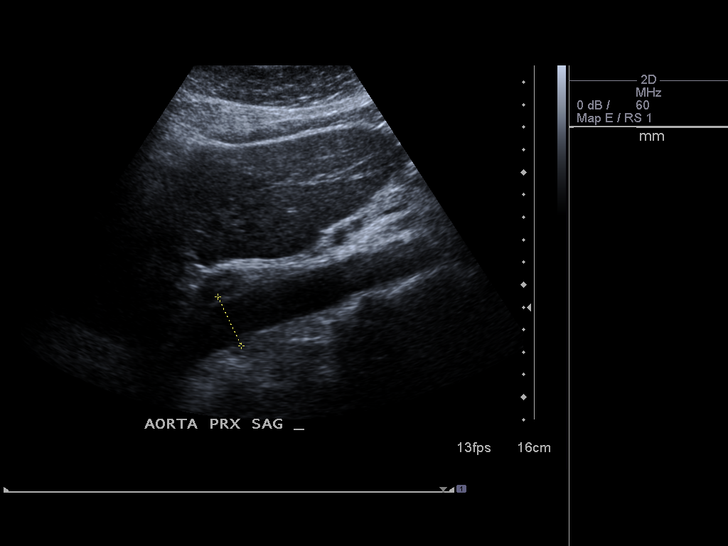
[im 6/63]
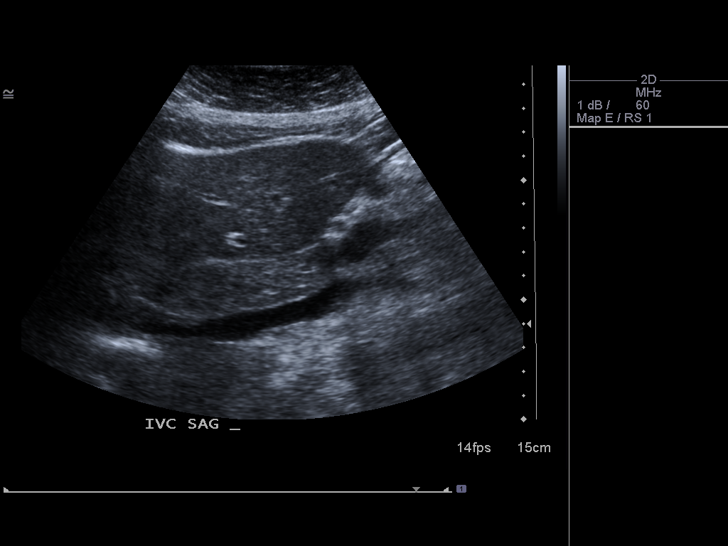
[im 11/63]
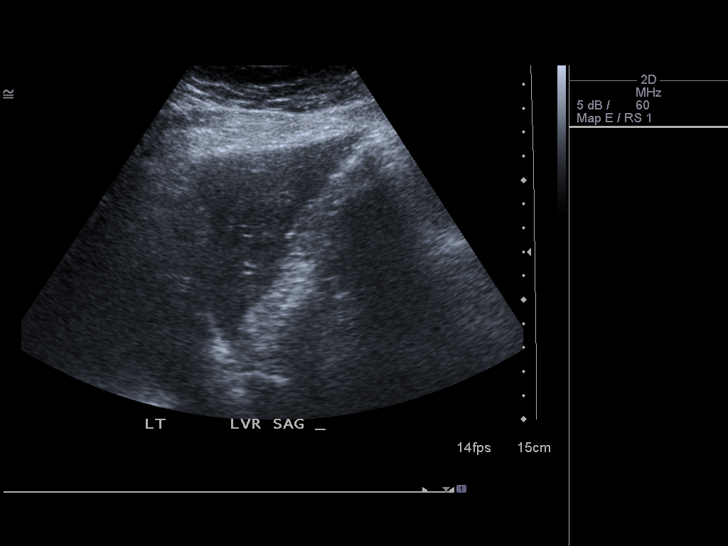
[im 16/63]
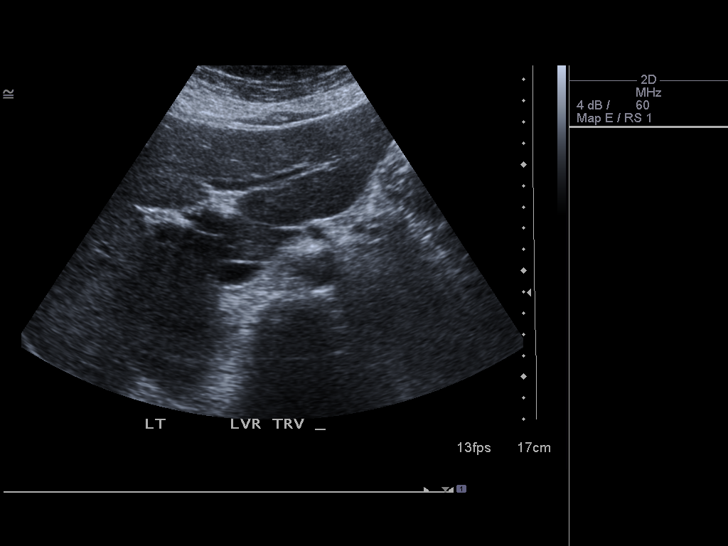
[im 21/63]
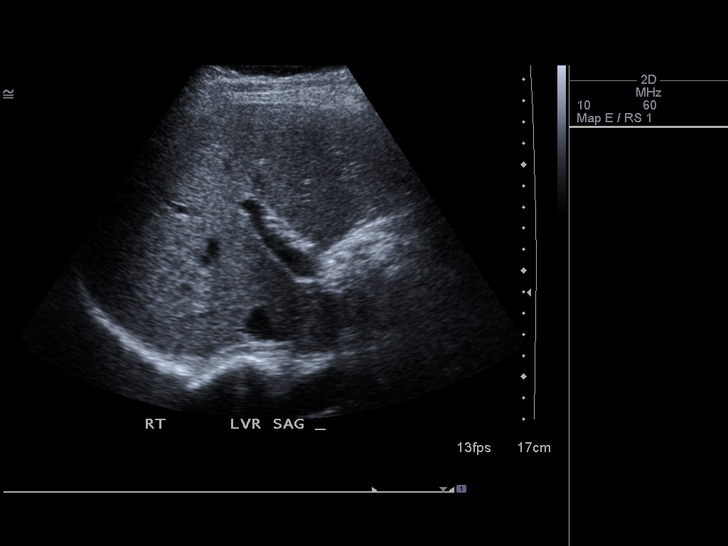
[im 24/63]
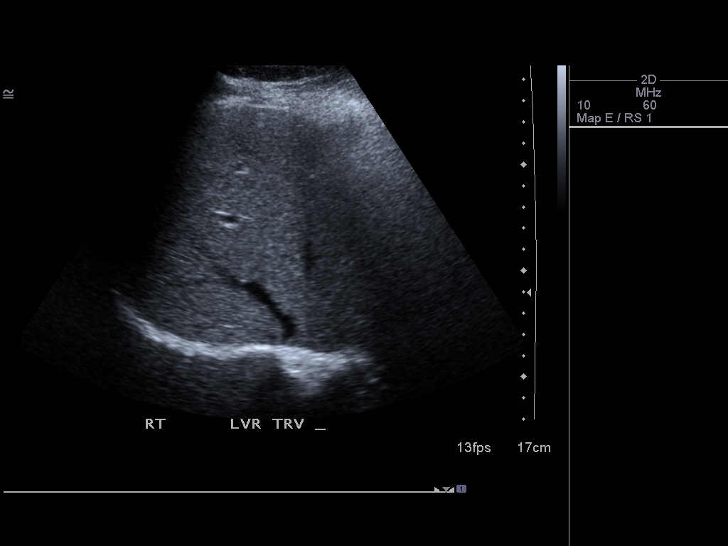
[im 29/63]
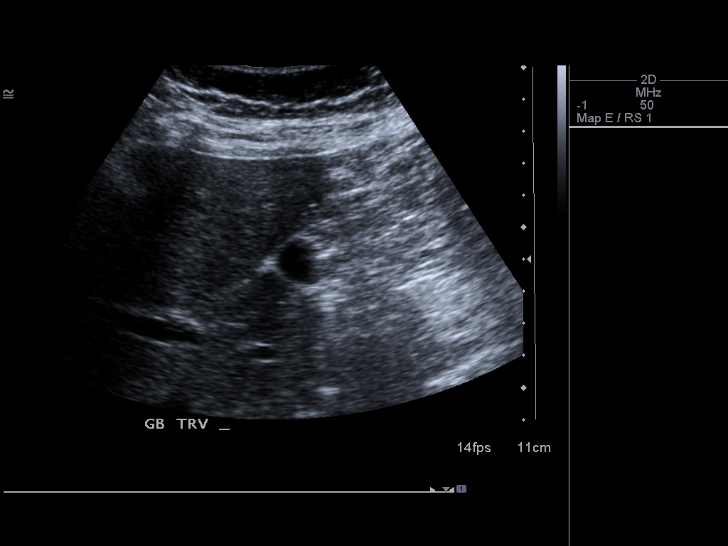
[im 34/63]
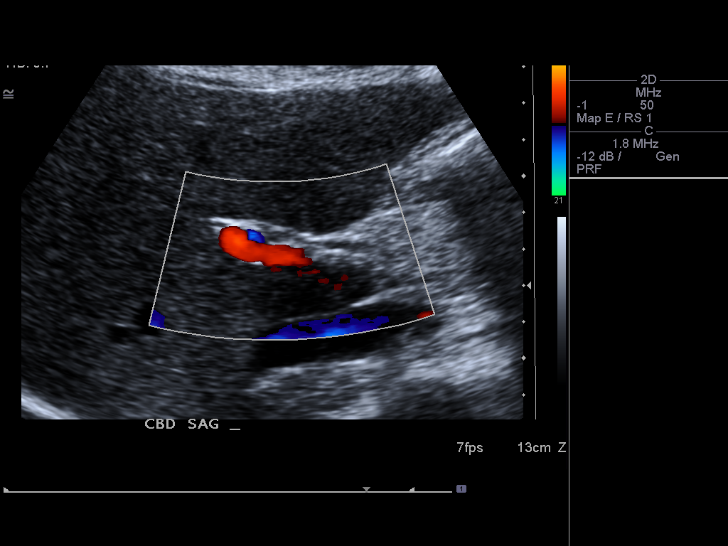
[im 39/63]
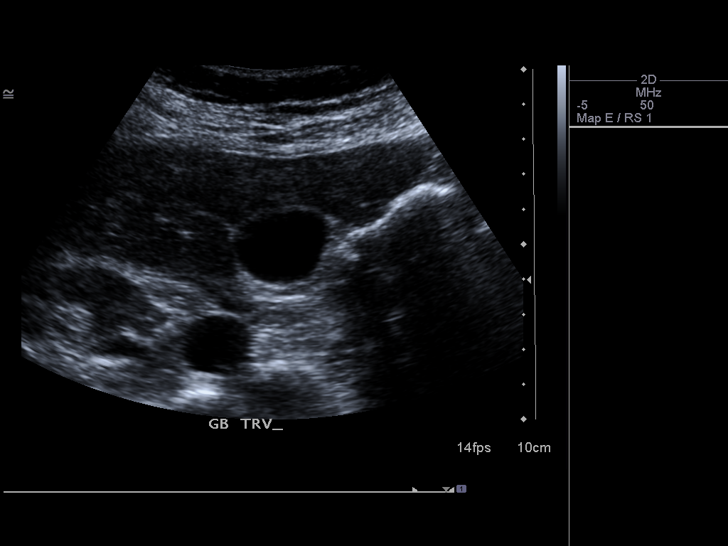
[im 42/63]
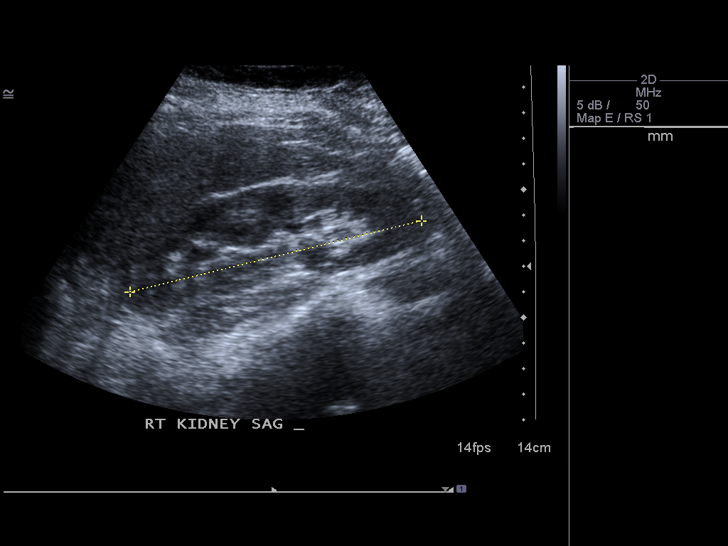
[im 47/63]
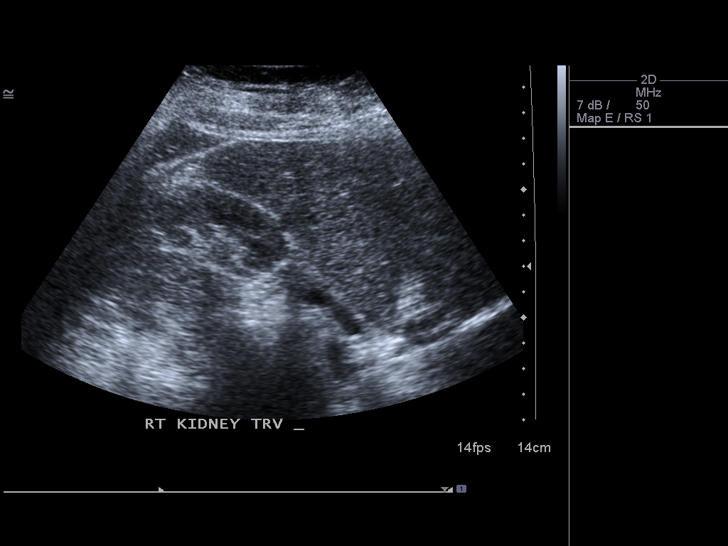
[im 52/63]
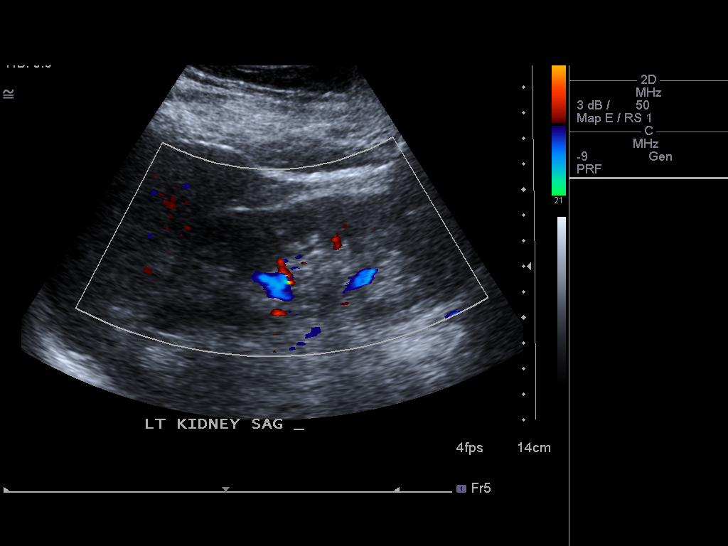
[im 57/63]
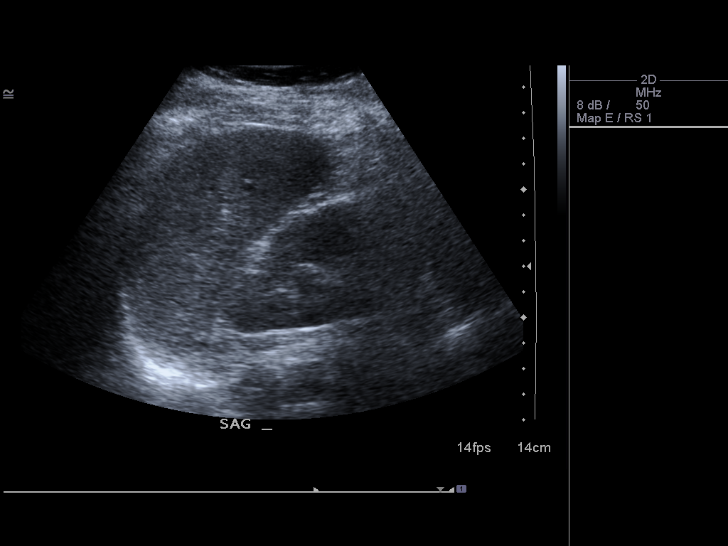
[im 63/63]
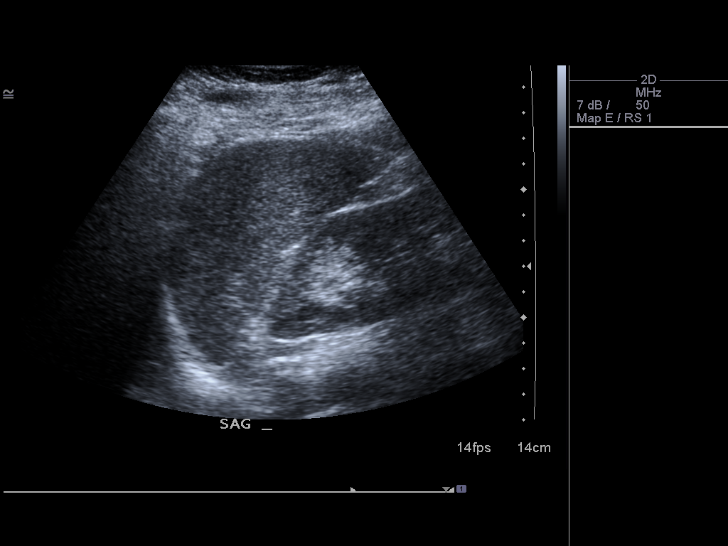

[14 of 25 positions shown; findings below may reference images not displayed]

FINDINGS: Gallbladder:  No gallstones, gallbladder wall thickening, or
pericholecystic fluid.

Common bile duct:  3.0 mm

Liver:  No focal lesion identified.  Within normal limits in
parenchymal echogenicity.

IVC:  Limited

Pancreas:  Pancreatic head and body are normal.  Pancreatic tail
obscured by bowel gas.

Spleen:  7.2 cm

Right Kidney:  11.8 cm.  Normal

Left Kidney:  11.6 cm.  Normal

Abdominal aorta:  No aneurysm identified.
IMPRESSION: Negative for gallstones.  No biliary dilatation.

## 2015-03-19 ENCOUNTER — Encounter: Payer: Self-pay | Admitting: Gynecology

## 2015-03-30 ENCOUNTER — Ambulatory Visit (INDEPENDENT_AMBULATORY_CARE_PROVIDER_SITE_OTHER): Payer: Commercial Managed Care - HMO | Admitting: Women's Health

## 2015-03-30 ENCOUNTER — Encounter: Payer: Self-pay | Admitting: Women's Health

## 2015-03-30 VITALS — BP 136/80 | Ht 62.0 in | Wt 204.0 lb

## 2015-03-30 DIAGNOSIS — Z13220 Encounter for screening for lipoid disorders: Secondary | ICD-10-CM | POA: Diagnosis not present

## 2015-03-30 DIAGNOSIS — Z1329 Encounter for screening for other suspected endocrine disorder: Secondary | ICD-10-CM

## 2015-03-30 DIAGNOSIS — N912 Amenorrhea, unspecified: Secondary | ICD-10-CM | POA: Diagnosis not present

## 2015-03-30 DIAGNOSIS — Z01419 Encounter for gynecological examination (general) (routine) without abnormal findings: Secondary | ICD-10-CM | POA: Diagnosis not present

## 2015-03-30 NOTE — Progress Notes (Signed)
Wanda Swanson [birth year] [id number]    History:    Presents for annual exam.  Last cycle November 2016 one prior to that January. Regular monthly cycle until the last 2 years. Last annual exam here 2013. Has quit smoking. 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 Mother kidney, colon, Uterine cancer and deceased from pancreatic cancer. 2013 had an extensive workup with Dr. Marin Olp, negative  genetic testing for Lynch syndrome was negative per patient. 2009 negative colon polyps, 2013 negative colonoscopy. History of a hydatiform  with second pregnancy.  Past medical history, past surgical history, family history and social history were all reviewed and documented in the EPIC chart. 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 One daughter age 64 doing well. Separated from husband no infidelity.  ROS:  A ROS was performed and pertinent positives and negatives are included.  Exam:  Filed Vitals:   03/30/15 1539  BP: 136/80    General appearance:  Normal Thyroid:  Symmetrical, normal in size, without palpable masses or nodularity. Respiratory  Auscultation:  Clear without wheezing or rhonchi Cardiovascular  Auscultation:  Regular rate, without rubs, murmurs or gallops  Edema/varicosities:  Not grossly evident Abdominal  Soft,nontender, without masses, guarding or rebound.  Liver/spleen:  No organomegaly noted  Hernia:  None appreciated  Skin  Inspection:  Grossly normal   Breasts: Examined lying and sitting.     Right: Without masses, retractions, discharge or axillary adenopathy.     Left: Without masses, retractions, discharge or axillary adenopathy. Gentitourinary   Inguinal/mons:  Normal without inguinal adenopathy  External genitalia:  Normal  BUS/Urethra/Skene's glands:  Normal  Vagina:  Normal  Cervix:  Normal  Uterus:  normal in size, shape and contour.  Midline and mobile  Adnexa/parametria:     Rt: Without masses or tenderness.   Lt: Without masses or  tenderness.  Anus and perineum: Normal  Digital rectal exam: Normal sphincter tone without palpated masses or tenderness  Assessment/Plan:  55 y.o. separated WF G2 P1 for annual exam with complaint of numerous superficial skin bumps..   Perimenopausal/not sexually active 10 CIN-3 with LEEP cone with normal Paps after Negative genetic testing for Lynch syndrome cancer Obese  Plan: Reports losing approximately 30 pounds in the past 6 months, congratulated, encouraged to continue healthy lifestyle of low carbohydrates and increased exercise. SBE's, overdue for annual screening mammogram instructed to schedule breast center information given and reviewed importance of annual screen. Instructed to schedule appointment with dermatologist for skin evaluation. Follow-up with Dr. Collene Mares as recommended for colonoscopy. Instructed to call if any further bleeding. CBC, FSH, lipid panel, TSH, CMP, vitamin D, UA, Pap with HR HPV typing, new screening guidelines reviewed. Denies need for counseling at this time.     Huel Cote [facility], 4:48 PM 03/30/2015

## 2015-03-30 NOTE — Patient Instructions (Signed)
COLONOSCOPY  Call Dr Collene Mares [phone]   Schedule mammogram  [phone]   Menopause is a normal process in which your reproductive ability comes to an end. This process happens gradually over a span of months to years, usually between the ages of 44 and 56. Menopause is complete when you have missed 12 consecutive menstrual periods. It is important to talk with your health care provider about some of the most common conditions that affect postmenopausal women, such as heart disease, cancer, and bone loss (osteoporosis). Adopting a healthy lifestyle and getting preventive care can help to promote your health and wellness. Those actions can also lower your chances of developing some of these common conditions. WHAT SHOULD I KNOW ABOUT MENOPAUSE? During menopause, you may experience a number of symptoms, such as:  Moderate-to-severe hot flashes.  Night sweats.  Decrease in sex drive.  Mood swings.  Headaches.  Tiredness.  Irritability.  Memory problems.  Insomnia. Choosing to treat or not to treat menopausal changes is an individual decision that you make with your health care provider. WHAT SHOULD I KNOW ABOUT HORMONE REPLACEMENT THERAPY AND SUPPLEMENTS? Hormone therapy products are effective for treating symptoms that are associated with menopause, such as hot flashes and night sweats. Hormone replacement carries certain risks, especially as you become older. If you are thinking about using estrogen or estrogen with progestin treatments, discuss the benefits and risks with your health care provider. WHAT SHOULD I KNOW ABOUT HEART DISEASE AND STROKE? Heart disease, heart attack, and stroke become more likely as you age. This may be due, in part, to the hormonal changes that your body experiences during menopause. These can affect how your body processes dietary fats, triglycerides, and cholesterol. Heart attack and stroke are both medical emergencies. There are many things that you can do to  help prevent heart disease and stroke:  Have your blood pressure checked at least every 1-2 years. High blood pressure causes heart disease and increases the risk of stroke.  If you are 49-22 years old, ask your health care provider if you should take aspirin to prevent a heart attack or a stroke.  Do not use any tobacco products, including cigarettes, chewing tobacco, or electronic cigarettes. If you need help quitting, ask your health care provider.  It is important to eat a healthy diet and maintain a healthy weight.  Be sure to include plenty of vegetables, fruits, low-fat dairy products, and lean protein.  Avoid eating foods that are high in solid fats, added sugars, or salt (sodium).  Get regular exercise. This is one of the most important things that you can do for your health.  Try to exercise for at least 150 minutes each week. The type of exercise that you do should increase your heart rate and make you sweat. This is known as moderate-intensity exercise.  Try to do strengthening exercises at least twice each week. Do these in addition to the moderate-intensity exercise.  Know your numbers.Ask your health care provider to check your cholesterol and your blood glucose. Continue to have your blood tested as directed by your health care provider. WHAT SHOULD I KNOW ABOUT CANCER SCREENING? There are several types of cancer. Take the following steps to reduce your risk and to catch any cancer development as early as possible. Breast Cancer  Practice breast self-awareness.  This means understanding how your breasts normally appear and feel.  It also means doing regular breast self-exams. Let your health care provider know about any changes, no matter  how small.  If you are 71 or older, have a clinician do a breast exam (clinical breast exam or CBE) every year. Depending on your age, family history, and medical history, it may be recommended that you also have a yearly breast X-ray  (mammogram).  If you have a family history of breast cancer, talk with your health care provider about genetic screening.  If you are at high risk for breast cancer, talk with your health care provider about having an MRI and a mammogram every year.  Breast cancer (BRCA) gene test is recommended for women who have family members with BRCA-related cancers. Results of the assessment will determine the need for genetic counseling and BRCA1 and for BRCA2 testing. BRCA-related cancers include these types:  Breast. This occurs in males or females.  Ovarian.  Tubal. This may also be called fallopian tube cancer.  Cancer of the abdominal or pelvic lining (peritoneal cancer).  Prostate.  Pancreatic. Cervical, Uterine, and Ovarian Cancer Your health care provider may recommend that you be screened regularly for cancer of the pelvic organs. These include your ovaries, uterus, and vagina. This screening involves a pelvic exam, which includes checking for microscopic changes to the surface of your cervix (Pap test).  For women ages 21-65, health care providers may recommend a pelvic exam and a Pap test every three years. For women ages 25-65, they may recommend the Pap test and pelvic exam, combined with testing for human papilloma virus (HPV), every five years. Some types of HPV increase your risk of cervical cancer. Testing for HPV may also be done on women of any age who have unclear Pap test results.  Other health care providers may not recommend any screening for nonpregnant women who are considered low risk for pelvic cancer and have no symptoms. Ask your health care provider if a screening pelvic exam is right for you.  If you have had past treatment for cervical cancer or a condition that could lead to cancer, you need Pap tests and screening for cancer for at least 20 years after your treatment. If Pap tests have been discontinued for you, your risk factors (such as having a new sexual partner)  need to be reassessed to determine if you should start having screenings again. Some women have medical problems that increase the chance of getting cervical cancer. In these cases, your health care provider may recommend that you have screening and Pap tests more often.  If you have a family history of uterine cancer or ovarian cancer, talk with your health care provider about genetic screening.  If you have vaginal bleeding after reaching menopause, tell your health care provider.  There are currently no reliable tests available to screen for ovarian cancer. Lung Cancer Lung cancer screening is recommended for adults 12-51 years old who are at high risk for lung cancer because of a history of smoking. A yearly low-dose CT scan of the lungs is recommended if you:  Currently smoke.  Have a history of at least 30 pack-years of smoking and you currently smoke or have quit within the past 15 years. A pack-year is smoking an average of one pack of cigarettes per day for one year. Yearly screening should:  Continue until it has been 15 years since you quit.  Stop if you develop a health problem that would prevent you from having lung cancer treatment. Colorectal Cancer  This type of cancer can be detected and can often be prevented.  Routine colorectal cancer screening  usually begins at age 39 and continues through age 55.  If you have risk factors for colon cancer, your health care provider may recommend that you be screened at an earlier age.  If you have a family history of colorectal cancer, talk with your health care provider about genetic screening.  Your health care provider may also recommend using home test kits to check for hidden blood in your stool.  A small camera at the end of a tube can be used to examine your colon directly (sigmoidoscopy or colonoscopy). This is done to check for the earliest forms of colorectal cancer.  Direct examination of the colon should be repeated  every 5-10 years until age 63. However, if early forms of precancerous polyps or small growths are found or if you have a family history or genetic risk for colorectal cancer, you may need to be screened more often. Skin Cancer  Check your skin from head to toe regularly.  Monitor any moles. Be sure to tell your health care provider:  About any new moles or changes in moles, especially if there is a change in a mole's shape or color.  If you have a mole that is larger than the size of a pencil eraser.  If any of your family members has a history of skin cancer, especially at a Adlean Hardeman age, talk with your health care provider about genetic screening.  Always use sunscreen. Apply sunscreen liberally and repeatedly throughout the day.  Whenever you are outside, protect yourself by wearing long sleeves, pants, a wide-brimmed hat, and sunglasses. WHAT SHOULD I KNOW ABOUT OSTEOPOROSIS? Osteoporosis is a condition in which bone destruction happens more quickly than new bone creation. After menopause, you may be at an increased risk for osteoporosis. To help prevent osteoporosis or the bone fractures that can happen because of osteoporosis, the following is recommended:  If you are 39-35 years old, get at least 1,000 mg of calcium and at least 600 mg of vitamin D per day.  If you are older than age 63 but younger than age 84, get at least 1,200 mg of calcium and at least 600 mg of vitamin D per day.  If you are older than age 51, get at least 1,200 mg of calcium and at least 800 mg of vitamin D per day. Smoking and excessive alcohol intake increase the risk of osteoporosis. Eat foods that are rich in calcium and vitamin D, and do weight-bearing exercises several times each week as directed by your health care provider. WHAT SHOULD I KNOW ABOUT HOW MENOPAUSE AFFECTS [place]? Depression may occur at any age, but it is more common as you become older. Common symptoms of depression  include:  Low or sad mood.  Changes in sleep patterns.  Changes in appetite or eating patterns.  Feeling an overall lack of motivation or enjoyment of activities that you previously enjoyed.  Frequent crying spells. Talk with your health care provider if you think that you are experiencing depression. WHAT SHOULD I KNOW ABOUT IMMUNIZATIONS? It is important that you get and maintain your immunizations. These include:  Tetanus, diphtheria, and pertussis (Tdap) booster vaccine.  Influenza every year before the flu season begins.  Pneumonia vaccine.  Shingles vaccine. Your health care provider may also recommend other immunizations.   This information is not intended to replace advice given to you by your health care provider. Make sure you discuss any questions you have with your health care provider.   Document  Released: 02/21/2005 Document Revised: 01/20/2014 Document Reviewed: 09/01/2013 [employer] Interactive Patient Education [employer].

## 2015-03-31 LAB — URINALYSIS W MICROSCOPIC + REFLEX CULTURE
BACTERIA UA: NONE SEEN [HPF]
BILIRUBIN URINE: NEGATIVE
Casts: NONE SEEN [LPF]
GLUCOSE, UA: NEGATIVE
KETONES UR: NEGATIVE
Leukocytes, UA: NEGATIVE
Nitrite: NEGATIVE
PROTEIN: NEGATIVE
RBC / HPF: NONE SEEN RBC/HPF (ref <=2)
Specific Gravity, Urine: 1.022 (ref 1.001–1.035)
WBC UA: NONE SEEN WBC/HPF (ref <=5)
Yeast: NONE SEEN [HPF]
pH: 6 (ref 5.0–8.0)

## 2015-03-31 LAB — CBC WITH DIFFERENTIAL/PLATELET
BASOS PCT: 1 % (ref 0–1)
Basophils Absolute: 0.1 10*3/uL (ref 0.0–0.1)
EOS PCT: 5 % (ref 0–5)
Eosinophils Absolute: 0.6 10*3/uL (ref 0.0–0.7)
HEMATOCRIT: 42.6 % (ref 36.0–46.0)
Hemoglobin: 15 g/dL (ref 12.0–15.0)
Lymphocytes Relative: 31 % (ref 12–46)
Lymphs Abs: 3.4 10*3/uL (ref 0.7–4.0)
MCH: 32.8 pg (ref 26.0–34.0)
MCHC: 35.2 g/dL (ref 30.0–36.0)
MCV: 93.2 fL (ref 78.0–100.0)
MONO ABS: 0.7 10*3/uL (ref 0.1–1.0)
MONOS PCT: 6 % (ref 3–12)
MPV: 10 fL (ref 8.6–12.4)
Neutro Abs: 6.3 10*3/uL (ref 1.7–7.7)
Neutrophils Relative %: 57 % (ref 43–77)
Platelets: 324 10*3/uL (ref 150–400)
RBC: 4.57 MIL/uL (ref 3.87–5.11)
RDW: 12.8 % (ref 11.5–15.5)
WBC: 11 10*3/uL — AB (ref 4.0–10.5)

## 2015-03-31 LAB — COMPREHENSIVE METABOLIC PANEL
ALBUMIN: 4.7 g/dL (ref 3.6–5.1)
ALK PHOS: 91 U/L (ref 33–130)
ALT: 24 U/L (ref 6–29)
AST: 26 U/L (ref 10–35)
BILIRUBIN TOTAL: 0.3 mg/dL (ref 0.2–1.2)
BUN: 22 mg/dL (ref 7–25)
CALCIUM: 9.9 mg/dL (ref 8.6–10.4)
CO2: 18 mmol/L — AB (ref 20–31)
Chloride: 107 mmol/L (ref 98–110)
Creat: 0.73 mg/dL (ref 0.50–1.05)
Glucose, Bld: 108 mg/dL — ABNORMAL HIGH (ref 65–99)
POTASSIUM: 4.4 mmol/L (ref 3.5–5.3)
Sodium: 141 mmol/L (ref 135–146)
Total Protein: 7.3 g/dL (ref 6.1–8.1)

## 2015-03-31 LAB — LIPID PANEL
CHOL/HDL RATIO: 5.8 ratio — AB (ref <=5.0)
Cholesterol: 260 mg/dL — ABNORMAL HIGH (ref 125–200)
HDL: 45 mg/dL — AB (ref >=46)
LDL CALC: 189 mg/dL — AB (ref <130)
TRIGLYCERIDES: 131 mg/dL (ref <150)
VLDL: 26 mg/dL (ref <30)

## 2015-03-31 LAB — TSH: TSH: 1.97 mIU/L

## 2015-04-02 LAB — FOLLICLE STIMULATING HORMONE

## 2015-04-03 ENCOUNTER — Other Ambulatory Visit: Payer: Commercial Managed Care - HMO

## 2015-04-03 ENCOUNTER — Other Ambulatory Visit: Payer: Self-pay | Admitting: Women's Health

## 2015-04-03 DIAGNOSIS — N951 Menopausal and female climacteric states: Secondary | ICD-10-CM

## 2015-04-03 LAB — PAP, TP IMAGING W/ HPV RNA, RFLX HPV TYPE 16,18/45: HPV mRNA, High Risk: NOT DETECTED

## 2015-04-03 LAB — FOLLICLE STIMULATING HORMONE: FSH: 33.4 m[IU]/mL

## 2015-04-04 ENCOUNTER — Other Ambulatory Visit: Payer: Self-pay | Admitting: Women's Health

## 2015-04-04 DIAGNOSIS — E7800 Pure hypercholesterolemia, unspecified: Secondary | ICD-10-CM

## 2015-09-07 ENCOUNTER — Other Ambulatory Visit: Payer: Commercial Managed Care - HMO

## 2015-10-05 ENCOUNTER — Other Ambulatory Visit: Payer: Commercial Managed Care - HMO

## 2015-10-05 DIAGNOSIS — E7800 Pure hypercholesterolemia, unspecified: Secondary | ICD-10-CM

## 2015-10-05 LAB — LIPID PANEL
Cholesterol: 212 mg/dL — ABNORMAL HIGH (ref 125–200)
HDL: 36 mg/dL — ABNORMAL LOW (ref >=46)
LDL Cholesterol: 151 mg/dL — ABNORMAL HIGH (ref <130)
Total CHOL/HDL Ratio: 5.9 Ratio — ABNORMAL HIGH (ref <=5.0)
Triglycerides: 126 mg/dL (ref <150)
VLDL: 25 mg/dL (ref <30)

## 2016-05-28 ENCOUNTER — Encounter: Payer: Self-pay | Admitting: Gynecology

## 2016-12-15 ENCOUNTER — Encounter: Payer: Self-pay | Admitting: Women's Health
